# Patient Record
Sex: Male | Born: 1935 | Race: White | Hispanic: No | Marital: Married
Health system: Southern US, Community
[De-identification: ages and names within clinical notes are randomized; demographics above are authoritative.]

---

## 2003-08-11 ENCOUNTER — Ambulatory Visit (HOSPITAL_COMMUNITY): Admission: RE | Admit: 2003-08-11 | Discharge: 2003-08-12 | Payer: Self-pay | Admitting: Cardiovascular Disease

## 2009-01-30 ENCOUNTER — Encounter (INDEPENDENT_AMBULATORY_CARE_PROVIDER_SITE_OTHER): Payer: Self-pay | Admitting: General Surgery

## 2009-01-30 ENCOUNTER — Ambulatory Visit (HOSPITAL_COMMUNITY): Admission: RE | Admit: 2009-01-30 | Discharge: 2009-01-30 | Payer: Self-pay | Admitting: General Surgery

## 2009-04-30 ENCOUNTER — Inpatient Hospital Stay (HOSPITAL_COMMUNITY): Admission: AD | Admit: 2009-04-30 | Discharge: 2009-05-04 | Payer: Self-pay | Admitting: Cardiovascular Disease

## 2010-10-10 LAB — BASIC METABOLIC PANEL
BUN: 9 mg/dL (ref 6–23)
Calcium: 8.6 mg/dL (ref 8.4–10.5)
Calcium: 8.7 mg/dL (ref 8.4–10.5)
GFR calc Af Amer: >60 mL/min (ref >60)
GFR calc non Af Amer: >60 mL/min (ref >60)
GFR calc non Af Amer: >60 mL/min (ref >60)
Glucose, Bld: 113 mg/dL — ABNORMAL HIGH (ref 70–99)
Glucose, Bld: 116 mg/dL — ABNORMAL HIGH (ref 70–99)
Sodium: 135 mEq/L (ref 135–145)
Sodium: 143 mEq/L (ref 135–145)

## 2010-10-10 LAB — COMPREHENSIVE METABOLIC PANEL
ALT: 22 U/L (ref 0–53)
AST: 25 U/L (ref 0–37)
Alkaline Phosphatase: 49 U/L (ref 39–117)
CO2: 30 mEq/L (ref 19–32)
Calcium: 9.4 mg/dL (ref 8.4–10.5)
Chloride: 102 mEq/L (ref 96–112)
GFR calc Af Amer: >60 mL/min (ref >60)
GFR calc non Af Amer: >60 mL/min (ref >60)
Glucose, Bld: 95 mg/dL (ref 70–99)
Potassium: 3.6 mEq/L (ref 3.5–5.1)
Sodium: 140 mEq/L (ref 135–145)

## 2010-10-10 LAB — CBC
HCT: 43.3 % (ref 39.0–52.0)
Hemoglobin: 15 g/dL (ref 13.0–17.0)
Hemoglobin: 16.5 g/dL (ref 13.0–17.0)
MCHC: 34.5 g/dL (ref 30.0–36.0)
MCV: 86.7 fL (ref 78.0–100.0)
Platelets: 203 10*3/uL (ref 150–400)
Platelets: 214 10*3/uL (ref 150–400)
RBC: 5 MIL/uL (ref 4.22–5.81)
RBC: 5.53 MIL/uL (ref 4.22–5.81)
RDW: 14.4 % (ref 11.5–15.5)
RDW: 14.5 % (ref 11.5–15.5)
WBC: 6.7 10*3/uL (ref 4.0–10.5)
WBC: 7.1 10*3/uL (ref 4.0–10.5)
WBC: 8.2 10*3/uL (ref 4.0–10.5)

## 2010-10-10 LAB — GLUCOSE, CAPILLARY
Glucose-Capillary: 103 mg/dL — ABNORMAL HIGH (ref 70–99)
Glucose-Capillary: 112 mg/dL — ABNORMAL HIGH (ref 70–99)
Glucose-Capillary: 113 mg/dL — ABNORMAL HIGH (ref 70–99)
Glucose-Capillary: 124 mg/dL — ABNORMAL HIGH (ref 70–99)
Glucose-Capillary: 125 mg/dL — ABNORMAL HIGH (ref 70–99)
Glucose-Capillary: 128 mg/dL — ABNORMAL HIGH (ref 70–99)
Glucose-Capillary: 128 mg/dL — ABNORMAL HIGH (ref 70–99)
Glucose-Capillary: 135 mg/dL — ABNORMAL HIGH (ref 70–99)
Glucose-Capillary: 163 mg/dL — ABNORMAL HIGH (ref 70–99)
Glucose-Capillary: 189 mg/dL — ABNORMAL HIGH (ref 70–99)
Glucose-Capillary: 77 mg/dL (ref 70–99)

## 2010-10-10 LAB — CARDIAC PANEL(CRET KIN+CKTOT+MB+TROPI)
CK, MB: 3.4 ng/mL (ref 0.3–4.0)
CK, MB: 5.8 ng/mL — ABNORMAL HIGH (ref 0.3–4.0)
Relative Index: 5.7 — ABNORMAL HIGH (ref 0.0–2.5)
Total CK: 101 U/L (ref 7–232)
Total CK: 90 U/L (ref 7–232)
Troponin I: 0.72 ng/mL (ref 0.00–0.06)

## 2010-10-10 LAB — HEMOGLOBIN A1C: Hgb A1c MFr Bld: 6.3 % — ABNORMAL HIGH (ref 4.6–6.1)

## 2010-10-10 LAB — PROTIME-INR: INR: 1.1 (ref 0.00–1.49)

## 2010-10-13 LAB — GLUCOSE, CAPILLARY: Glucose-Capillary: 111 mg/dL — ABNORMAL HIGH (ref 70–99)

## 2010-11-19 NOTE — H&P (Signed)
Christopher Carey, Christopher Carey                 ACCOUNT NO.:  [id number]   MEDICAL RECORD NO.:  [id number]          PATIENT TYPE:  AMB   LOCATION:  DAY                           FACILITY:  [facility]   PHYSICIAN:  Dalia Heading, M.D.  DATE OF BIRTH:  May 26, 1936   DATE OF ADMISSION:  DATE OF DISCHARGE:  LH                              HISTORY & PHYSICAL   CHIEF COMPLAINT:  Need for screening colonoscopy.   HISTORY OF PRESENT ILLNESS:  The patient is a 75 year old white male who  is referred for endoscopic evaluation.  He needs colonoscopy for  screening purposes.  No abdominal pain, weight loss, nausea, vomiting,  diarrhea, constipation, melena, hematochezia had been noted.  He has  never had a colonoscopy.  There is no family history of colon carcinoma.   PAST MEDICAL HISTORY:  Non-insulin dependent diabetes mellitus,  hypertension.   PAST SURGICAL HISTORY:  Coronary stent placement in the remote past.   CURRENT MEDICATIONS:  Avalide 30/25 mg p.o. daily, metformin 500 mg p.o.  daily, baby aspirin 81 mg p.o. daily.   ALLERGIES:  No known drug allergies.   REVIEW OF SYSTEMS:  Noncontributory.   PHYSICAL EXAMINATION:  GENERAL:  The patient is a well-developed, well-  nourished white male in no acute distress.  LUNGS:  Clear to auscultation with equal breath sounds bilaterally.  HEART:  Regular rate and rhythm without S3, S4, or murmurs.  ABDOMEN:  Soft, nontender, nondistended.  No hepatosplenomegaly or  masses noted.  RECTAL:  Deferred to the procedure.   IMPRESSION:  Need for screening colonoscopy.   PLAN:  The patient is scheduled for colonoscopy on January 30, 2009.  The  risks and benefits of the procedure including bleeding and perforation  were fully explained to the patient, gave informed consent.      Dalia Heading, M.D.  Electronically Signed     MAJ/MEDQ  D:  01/23/2009  T:  01/24/2009  Job:  [id number]   cc:   Corrie Mckusick, M.D.  Fax: [phone]

## 2010-11-22 NOTE — Cardiovascular Report (Signed)
Christopher Carey, WATCHMAN NO.:  [id number]   MEDICAL RECORD NO.:  [id number]                   PATIENT TYPE:  OIB   LOCATION:  6525                                 FACILITY:  [facility]   PHYSICIAN:  Nanetta Batty, M.D.                DATE OF BIRTH:  Mar 01, 1936   DATE OF PROCEDURE:  06/09/2004  DATE OF DISCHARGE:  08/12/2003                              CARDIAC CATHETERIZATION   INDICATIONS:  Mr. Symanski is a 75 year old gentleman patient of Dr. Beatrice Lecher  with history of hypertension and family history of coronary artery disease  who is complaining of chest pain and shortness of breath.  Cardiolite stress  test performed August 07, 2003 showed inferolateral ischemia.  He presents  now for diagnostic coronary arteriography.   PROCEDURE DESCRIPTION:  The patient was brought to the second floor [facility] in the postabsorptive state.  He was  premedicated with p.o. Valium and IV Nubain.  His right groin was prepped  and shaved in the usual sterile fashion.  1% Xylocaine was used for local  anesthesia.  A 6 French sheath was inserted into the right femoral artery  using standard Seldinger technique. A 6 French right and left Judkins  diagnostic catheter as well as 6 French pigtail catheter were used for  selective coronary angiography, left ventriculography, subselective left  internal mammary artery angiography and distal abdominal aortography.  Omnipaque dye was used for the entirety of the case. Retrograde aortic,  ventricular and pullback pressures were recorded.   HEMODYNAMICS:  1. Aortic systolic pressure 150, diastolic pressure 83.  2. Left ventricular systolic pressure 157, end-diastolic pressure 28.   SELECTIVE CORONARY ANGIOGRAPHY:  1. Left main normal.  2. LAD:  The LAD had an 80% fairly concentric focal lesion in its proximal     portion just at the first septal perforator and after the first diagonal     branch.  3.  Left circumflex:  This is a left dominant system.  The first marginal     branch that was approximately 2.0 to 2.3 vessel that was approximately     bifurcating.  There is 80% proximal stenosis.  The inferior branch of the     bifurcation was totally occluded and filled by left-to-left collaterals.  4. Right coronary artery:  Nondominant, small and insignificant disease.   LEFT VENTRICULOGRAPHY:  RAO left ventriculogram was performed using 25 mL of  Omnipaque dye at 12 mL per second.  The overall LVEF is estimated at  approximately 50% without focal wall motion abnormalities.   DISTAL ABDOMINAL AORTOGRAPHY:  Distal abdominal aortogram was performed  using 20 mL of Omnipaque dye, 20 mL per second. The renal arteries were  widely patent.  The infrarenal abdominal aorta and iliac bifurcation  appeared free of significant atherosclerotic changes.   IMPRESSION:  Mr. Allcock has high grade proximal left anterior descending  disease and circumflex marginal disease in a left dominant system.  Plans  will be for PCI and stenting of the left anterior descending using a Taxus  drug-eluting stent and Angiomax anticoagulation followed by  __________circumflex obtuse marginal branch PCI.   PROCEDURE DESCRIPTION:  The existing 6 French sheath in the right femoral  artery was exchanged over wire for a 7 [place] sheath.  A 6 French sheath was  then inserted into the right femoral vein.  A 7 [place] JL-4 guide catheter  along with a 0.014, 190 support guide wire and a 2.5/12 Quantum Maverick  were used for PCI.  The patient received 325 mg of enteric coated aspirin  this morning, 600 mg of p.o. Plavix and Angiomax bolus with an ACT of  approximately 300.  Omnipaque dye was used for the entirety of the case.  Retrograde aortic pressure was monitored during the case.   The 0.014 support wire was placed across the LAD stenosis and predilatation  was performed with 2.5/12 Quantum at 10 atmospheres.  Following  this, a  3.5/16 Taxus drug-eluting stent was then deployed at 13 atmospheres (3.75  mm) and 200 mcg of intracoronary nitroglycerin was then administered.  Final  angiographic result was reduction of 80% concentric proximal focal LAD  stenosis to 0% residual.  There was some encroachment of the first septal  perforator.   The wire was then withdrawn and advanced down the circumflex into the  marginal branch.  PCI was performed with a 2.0/12 Quantum at nominal  pressures.  Attempts were then made to wire the inferior branch, the  bifurcating marginal unsuccessfully.  I was then unable to rewire the  superior branch.  There was TIMI-3 flow at the end of the case with  approximately 40-50% residual stenosis in the proximal marginal.  The  patient did complain of some chest pain at the end of the case which  resolved with nitroglycerin and Nubain.  There were no electrocardiographic  or hemodynamic sequela.   OVERALL IMPRESSION:  Successful proximal left anterior descending  percutaneous coronary intervention and stenting using Angiomax, aspirin,  Plavix and a drug-eluting stent with PCI of the circumflex marginal branch  and suboptimal result.  However, the marginal branch was a small vessel.  The wire and guide catheter were removed.  The sheaths were sewn securely in  place.  The patient left the lab in stable condition.  Sheaths will be  removed in approximately 2 hours.  His enzymes will be cycled.  He will be  discharged home in the morning on medical therapy, aspirin and Plavix.                                               Nanetta Batty, M.D.    Cordelia Pen  D:  08/11/2003  T:  08/12/2003  Job:  [id number]   cc:   [facility]   Corrie Mckusick, M.D.  [address] [place]  [place]   [address]  Fax: [phone]

## 2010-11-22 NOTE — Discharge Summary (Signed)
NAMEVENUS, Christopher Carey                             ACCOUNT NO.:  [id number]   MEDICAL RECORD NO.:  [id number]                   PATIENT TYPE:  OIB   LOCATION:  6525                                 FACILITY:  [facility]   PHYSICIAN:  Nanetta Batty, M.D.                DATE OF BIRTH:  Nov 15, 1935   DATE OF ADMISSION:  DATE OF DISCHARGE:  08/12/2003                                 DISCHARGE SUMMARY   DISCHARGE DIAGNOSES:  1. Coronary artery disease.  2. Hypertension.  3. Glaucoma.  4. Chronic skin rash.  5. Significant premature family history of heart disease.   HISTORY OF PRESENT ILLNESS/HOSPITAL COURSE:  This is a 75 year old  gentleman, patient of Dr. Allyson Sabal who underwent outpatient Cardiolite which  showed a normal result and was scheduled for elective cardiac  catheterization.   He presented to [facility] on August 11, 2003, and was admitted  through [facility], and underwent catheterization on August 11, 2003.  His catheterization showed high-grade lesion of the left anterior descending  and obtuse marginal branch of circumflex stenosis.   Dr. Allyson Sabal performed angioplasty and stenting of the LAD with reduction of  80% lesion in the  proximal left anterior descending to 0%, and angioplasty  was performed on the obtuse marginal branch of the circumflex with a  reduction of the lesion from 80 to 40 to 50%.  The patient tolerated the  procedure well.   The next morning, he was assessed by Dr. Allyson Sabal.  His vital signs were  stable.  His right groin revealed a soft bruit, but the patient did not  present any complaints of groin pain or discomfort.   He was stable for discharge home on aspirin and Plavix.  He was informed  that if right groin developed some signs of oozing or swelling or any other  abnormal signs, he __________ would follow up with groin duplex ultrasound.  Otherwise, the patient is to see Dr. Allyson Sabal in two weeks in our office for  followup.   DISCHARGE  ACTIVITY:  1. No driving for three days.  2. After catheterization, no strenuous activity, no lifting greater than     five pounds.   DISCHARGE DIET:  Low fat, low cholesterol diet.   DISCHARGE MEDICATIONS:  1. Lopressor 50 mg daily.  2. Baby aspirin 81 mg daily.  3. Plavix 75 mg daily.      Raymon Mutton, P.A.                    Nanetta Batty, M.D.    MK/MEDQ  D:  08/12/2003  T:  08/13/2003  Job:  [id number]   cc:   [facility]

## 2011-07-22 ENCOUNTER — Ambulatory Visit (HOSPITAL_COMMUNITY)
Admission: RE | Admit: 2011-07-22 | Discharge: 2011-07-22 | Disposition: A | Discharge status: Home / Self Care | Payer: Medicare Other | Source: Ambulatory Visit | Attending: Internal Medicine | Admitting: Internal Medicine

## 2011-07-22 ENCOUNTER — Other Ambulatory Visit (HOSPITAL_COMMUNITY): Payer: Self-pay | Admitting: Internal Medicine

## 2011-07-22 DIAGNOSIS — J069 Acute upper respiratory infection, unspecified: Secondary | ICD-10-CM

## 2011-07-22 DIAGNOSIS — R059 Cough, unspecified: Secondary | ICD-10-CM | POA: Insufficient documentation

## 2011-07-22 DIAGNOSIS — R05 Cough: Secondary | ICD-10-CM | POA: Insufficient documentation

## 2013-02-10 IMAGING — CR DG CHEST 2V
2 series · 2 of 2 positions shown · non-contrast
Comparison: April 30, 2009

CLINICAL DATA: Cough, upper respiratory infection

CHEST - 2 VIEW

[view not recorded (1 of 2)]
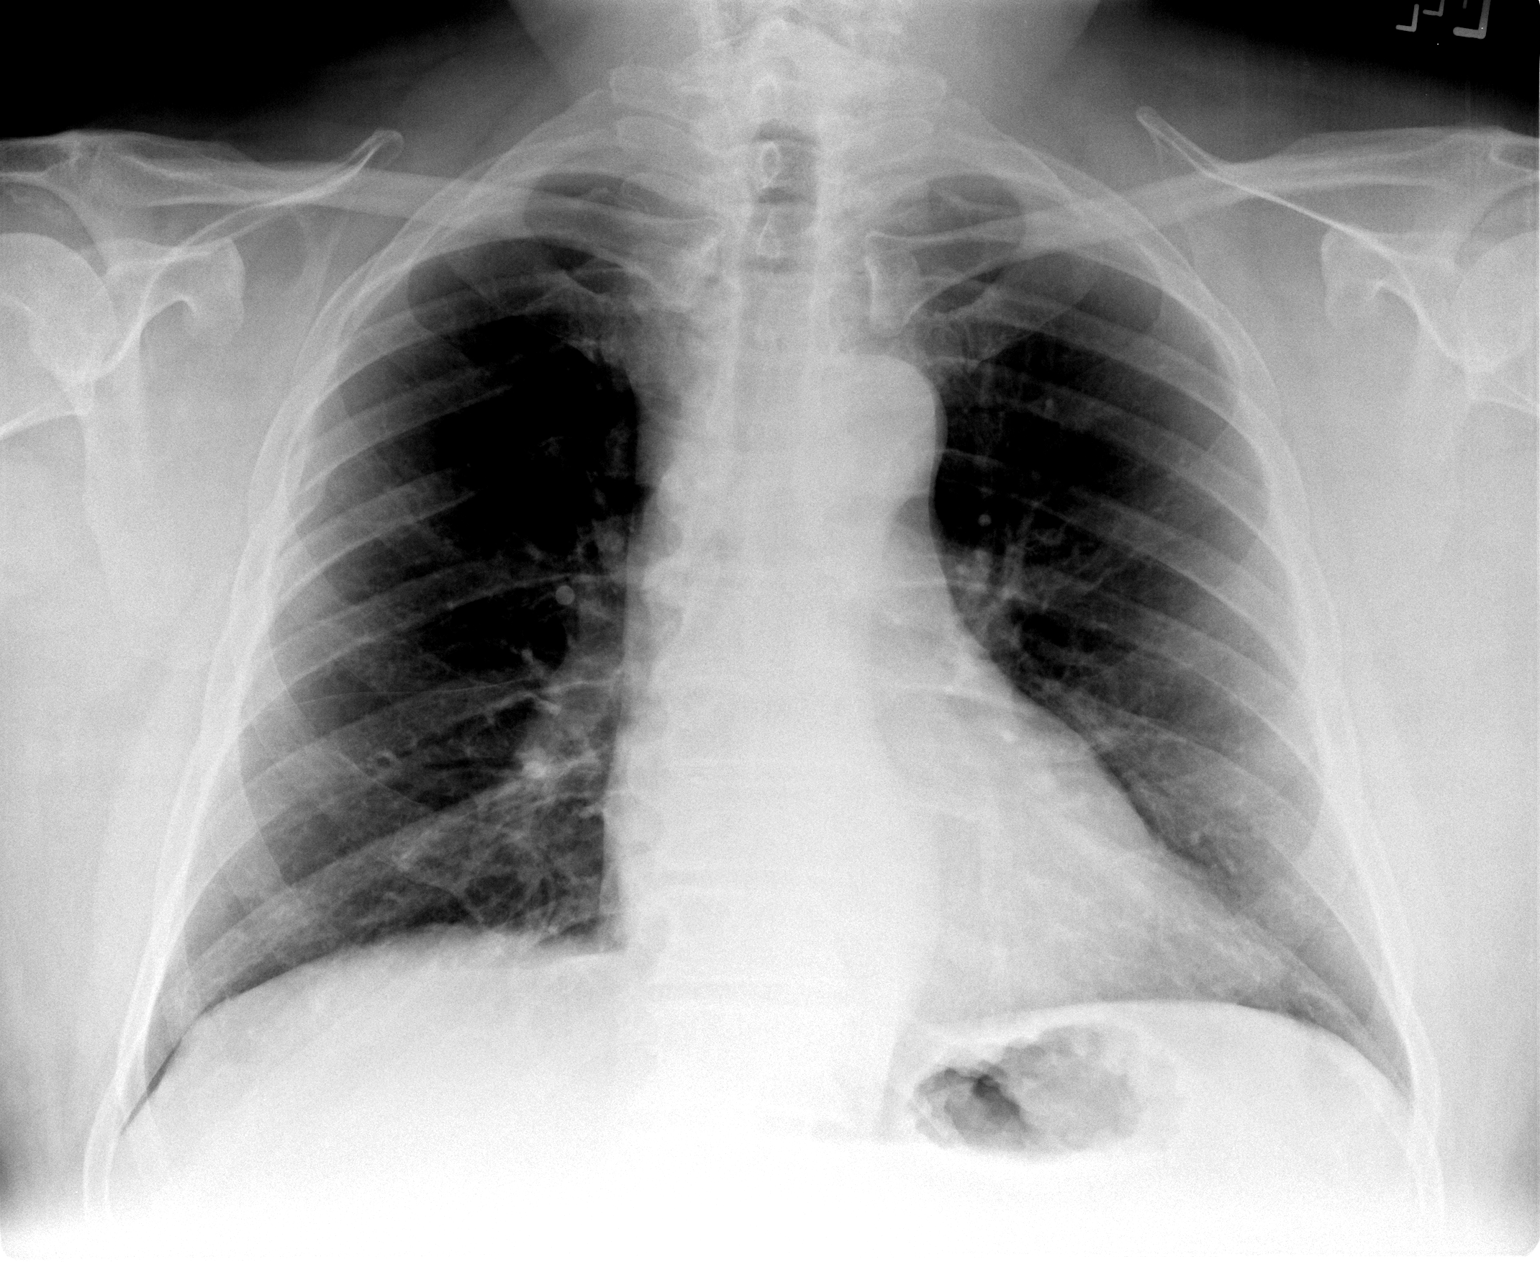

[view not recorded (2 of 2)]
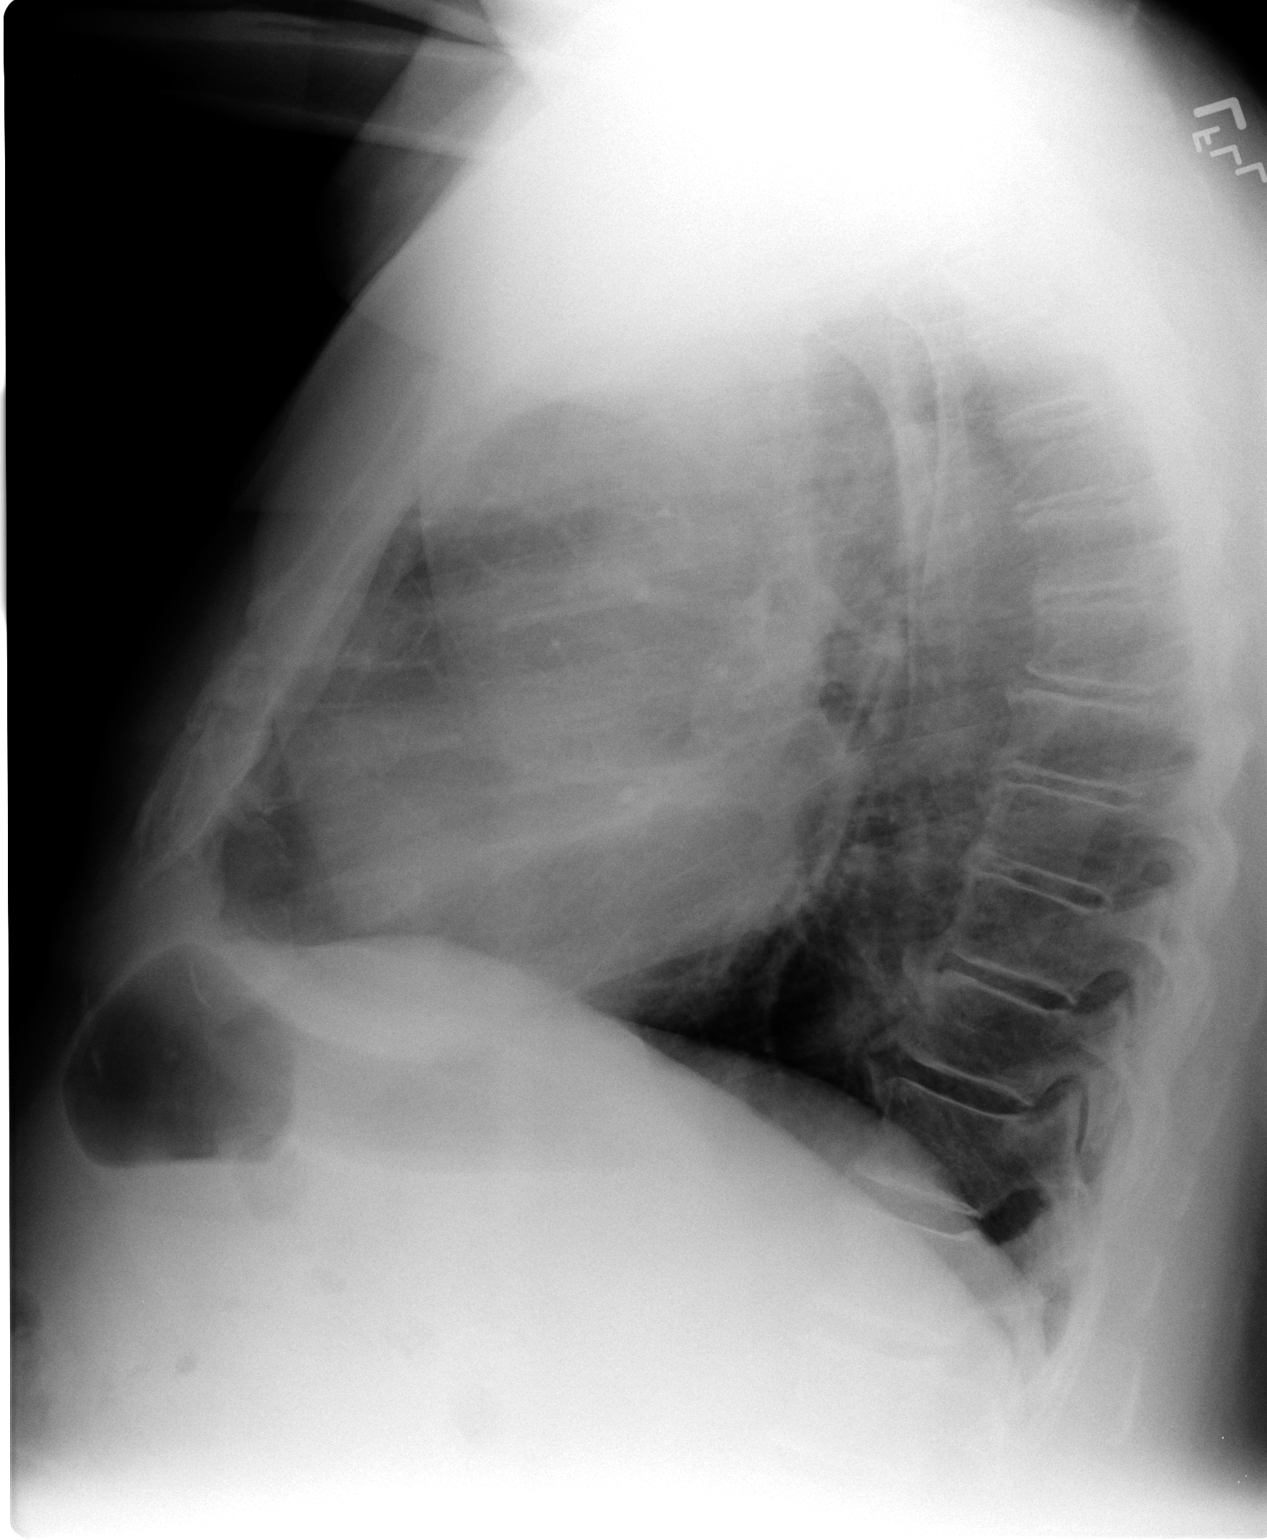

[2 of 2 positions shown; findings below may reference images not displayed]

FINDINGS: The cardiac silhouette, mediastinum, pulmonary
vasculature are within normal limits.  Both lungs are clear.
There is no acute bony abnormality.
IMPRESSION: There is no evidence of acute cardiac or pulmonary process.

## 2014-09-18 DIAGNOSIS — Z6838 Body mass index (BMI) 38.0-38.9, adult: Secondary | ICD-10-CM | POA: Diagnosis not present

## 2014-09-18 DIAGNOSIS — I10 Essential (primary) hypertension: Secondary | ICD-10-CM | POA: Diagnosis not present

## 2014-09-18 DIAGNOSIS — E782 Mixed hyperlipidemia: Secondary | ICD-10-CM | POA: Diagnosis not present

## 2014-09-18 DIAGNOSIS — R972 Elevated prostate specific antigen [PSA]: Secondary | ICD-10-CM | POA: Diagnosis not present

## 2014-09-18 DIAGNOSIS — E119 Type 2 diabetes mellitus without complications: Secondary | ICD-10-CM | POA: Diagnosis not present

## 2015-02-13 DIAGNOSIS — Z6841 Body Mass Index (BMI) 40.0 and over, adult: Secondary | ICD-10-CM | POA: Diagnosis not present

## 2015-02-13 DIAGNOSIS — I252 Old myocardial infarction: Secondary | ICD-10-CM | POA: Diagnosis not present

## 2015-02-13 DIAGNOSIS — I10 Essential (primary) hypertension: Secondary | ICD-10-CM | POA: Diagnosis not present

## 2015-02-13 DIAGNOSIS — E119 Type 2 diabetes mellitus without complications: Secondary | ICD-10-CM | POA: Diagnosis not present

## 2015-08-29 DIAGNOSIS — Z0000 Encounter for general adult medical examination without abnormal findings: Secondary | ICD-10-CM | POA: Diagnosis not present

## 2015-08-29 DIAGNOSIS — Z1389 Encounter for screening for other disorder: Secondary | ICD-10-CM | POA: Diagnosis not present

## 2015-08-29 DIAGNOSIS — Z6838 Body mass index (BMI) 38.0-38.9, adult: Secondary | ICD-10-CM | POA: Diagnosis not present

## 2015-09-12 DIAGNOSIS — I10 Essential (primary) hypertension: Secondary | ICD-10-CM | POA: Diagnosis not present

## 2015-09-12 DIAGNOSIS — E782 Mixed hyperlipidemia: Secondary | ICD-10-CM | POA: Diagnosis not present

## 2015-09-12 DIAGNOSIS — Z6837 Body mass index (BMI) 37.0-37.9, adult: Secondary | ICD-10-CM | POA: Diagnosis not present

## 2015-09-12 DIAGNOSIS — E119 Type 2 diabetes mellitus without complications: Secondary | ICD-10-CM | POA: Diagnosis not present

## 2015-09-12 DIAGNOSIS — Z1389 Encounter for screening for other disorder: Secondary | ICD-10-CM | POA: Diagnosis not present

## 2016-09-04 DIAGNOSIS — Z125 Encounter for screening for malignant neoplasm of prostate: Secondary | ICD-10-CM | POA: Diagnosis not present

## 2016-09-04 DIAGNOSIS — I2510 Atherosclerotic heart disease of native coronary artery without angina pectoris: Secondary | ICD-10-CM | POA: Diagnosis not present

## 2016-09-04 DIAGNOSIS — Z0000 Encounter for general adult medical examination without abnormal findings: Secondary | ICD-10-CM | POA: Diagnosis not present

## 2016-09-04 DIAGNOSIS — Z1389 Encounter for screening for other disorder: Secondary | ICD-10-CM | POA: Diagnosis not present

## 2016-09-04 DIAGNOSIS — I10 Essential (primary) hypertension: Secondary | ICD-10-CM | POA: Diagnosis not present

## 2016-09-04 DIAGNOSIS — E1165 Type 2 diabetes mellitus with hyperglycemia: Secondary | ICD-10-CM | POA: Diagnosis not present

## 2016-09-04 DIAGNOSIS — Z6836 Body mass index (BMI) 36.0-36.9, adult: Secondary | ICD-10-CM | POA: Diagnosis not present

## 2016-09-04 DIAGNOSIS — E782 Mixed hyperlipidemia: Secondary | ICD-10-CM | POA: Diagnosis not present

## 2016-10-22 DIAGNOSIS — H40013 Open angle with borderline findings, low risk, bilateral: Secondary | ICD-10-CM | POA: Diagnosis not present

## 2016-10-22 DIAGNOSIS — H40053 Ocular hypertension, bilateral: Secondary | ICD-10-CM | POA: Diagnosis not present

## 2016-10-22 DIAGNOSIS — Z961 Presence of intraocular lens: Secondary | ICD-10-CM | POA: Diagnosis not present

## 2017-01-29 DIAGNOSIS — E0590 Thyrotoxicosis, unspecified without thyrotoxic crisis or storm: Secondary | ICD-10-CM | POA: Diagnosis not present

## 2017-05-11 DIAGNOSIS — H40053 Ocular hypertension, bilateral: Secondary | ICD-10-CM | POA: Diagnosis not present

## 2017-05-11 DIAGNOSIS — H40013 Open angle with borderline findings, low risk, bilateral: Secondary | ICD-10-CM | POA: Diagnosis not present

## 2017-06-25 DIAGNOSIS — I10 Essential (primary) hypertension: Secondary | ICD-10-CM | POA: Diagnosis not present

## 2017-06-25 DIAGNOSIS — R29714 NIHSS score 14: Secondary | ICD-10-CM | POA: Diagnosis not present

## 2017-06-25 DIAGNOSIS — G8191 Hemiplegia, unspecified affecting right dominant side: Secondary | ICD-10-CM | POA: Diagnosis not present

## 2017-06-25 DIAGNOSIS — Z881 Allergy status to other antibiotic agents status: Secondary | ICD-10-CM | POA: Diagnosis not present

## 2017-06-25 DIAGNOSIS — I2510 Atherosclerotic heart disease of native coronary artery without angina pectoris: Secondary | ICD-10-CM | POA: Diagnosis not present

## 2017-06-25 DIAGNOSIS — Z955 Presence of coronary angioplasty implant and graft: Secondary | ICD-10-CM | POA: Diagnosis not present

## 2017-06-25 DIAGNOSIS — I444 Left anterior fascicular block: Secondary | ICD-10-CM | POA: Diagnosis not present

## 2017-06-25 DIAGNOSIS — I69898 Other sequelae of other cerebrovascular disease: Secondary | ICD-10-CM | POA: Diagnosis not present

## 2017-06-25 DIAGNOSIS — K5900 Constipation, unspecified: Secondary | ICD-10-CM | POA: Diagnosis not present

## 2017-06-25 DIAGNOSIS — R2689 Other abnormalities of gait and mobility: Secondary | ICD-10-CM | POA: Diagnosis not present

## 2017-06-25 DIAGNOSIS — R200 Anesthesia of skin: Secondary | ICD-10-CM | POA: Diagnosis not present

## 2017-06-25 DIAGNOSIS — M6281 Muscle weakness (generalized): Secondary | ICD-10-CM | POA: Diagnosis not present

## 2017-06-25 DIAGNOSIS — E669 Obesity, unspecified: Secondary | ICD-10-CM | POA: Diagnosis not present

## 2017-06-25 DIAGNOSIS — I69922 Dysarthria following unspecified cerebrovascular disease: Secondary | ICD-10-CM | POA: Diagnosis not present

## 2017-06-25 DIAGNOSIS — E785 Hyperlipidemia, unspecified: Secondary | ICD-10-CM | POA: Diagnosis not present

## 2017-06-25 DIAGNOSIS — R1312 Dysphagia, oropharyngeal phase: Secondary | ICD-10-CM | POA: Diagnosis not present

## 2017-06-25 DIAGNOSIS — I6782 Cerebral ischemia: Secondary | ICD-10-CM | POA: Diagnosis not present

## 2017-06-25 DIAGNOSIS — T466X5A Adverse effect of antihyperlipidemic and antiarteriosclerotic drugs, initial encounter: Secondary | ICD-10-CM | POA: Diagnosis not present

## 2017-06-25 DIAGNOSIS — R29810 Facial weakness: Secondary | ICD-10-CM | POA: Diagnosis not present

## 2017-06-25 DIAGNOSIS — Z7982 Long term (current) use of aspirin: Secondary | ICD-10-CM | POA: Diagnosis not present

## 2017-06-25 DIAGNOSIS — G459 Transient cerebral ischemic attack, unspecified: Secondary | ICD-10-CM | POA: Diagnosis not present

## 2017-06-25 DIAGNOSIS — I452 Bifascicular block: Secondary | ICD-10-CM | POA: Diagnosis not present

## 2017-06-25 DIAGNOSIS — R4701 Aphasia: Secondary | ICD-10-CM | POA: Diagnosis not present

## 2017-06-25 DIAGNOSIS — E119 Type 2 diabetes mellitus without complications: Secondary | ICD-10-CM | POA: Diagnosis not present

## 2017-06-25 DIAGNOSIS — I6389 Other cerebral infarction: Secondary | ICD-10-CM | POA: Diagnosis not present

## 2017-06-25 DIAGNOSIS — Z794 Long term (current) use of insulin: Secondary | ICD-10-CM | POA: Diagnosis not present

## 2017-06-25 DIAGNOSIS — I639 Cerebral infarction, unspecified: Secondary | ICD-10-CM | POA: Diagnosis not present

## 2017-06-25 DIAGNOSIS — I228 Subsequent ST elevation (STEMI) myocardial infarction of other sites: Secondary | ICD-10-CM | POA: Diagnosis not present

## 2017-06-25 DIAGNOSIS — I4510 Unspecified right bundle-branch block: Secondary | ICD-10-CM | POA: Diagnosis not present

## 2017-06-25 DIAGNOSIS — G8190 Hemiplegia, unspecified affecting unspecified side: Secondary | ICD-10-CM | POA: Diagnosis not present

## 2017-06-25 DIAGNOSIS — I2129 ST elevation (STEMI) myocardial infarction involving other sites: Secondary | ICD-10-CM | POA: Diagnosis not present

## 2017-06-25 DIAGNOSIS — E1159 Type 2 diabetes mellitus with other circulatory complications: Secondary | ICD-10-CM | POA: Diagnosis not present

## 2017-06-25 DIAGNOSIS — B372 Candidiasis of skin and nail: Secondary | ICD-10-CM | POA: Diagnosis not present

## 2017-06-25 DIAGNOSIS — R531 Weakness: Secondary | ICD-10-CM | POA: Diagnosis not present

## 2017-06-25 DIAGNOSIS — I6381 Other cerebral infarction due to occlusion or stenosis of small artery: Secondary | ICD-10-CM | POA: Diagnosis not present

## 2017-06-25 DIAGNOSIS — R633 Feeding difficulties: Secondary | ICD-10-CM | POA: Diagnosis not present

## 2017-06-25 DIAGNOSIS — I69920 Aphasia following unspecified cerebrovascular disease: Secondary | ICD-10-CM | POA: Diagnosis not present

## 2017-06-25 DIAGNOSIS — I69991 Dysphagia following unspecified cerebrovascular disease: Secondary | ICD-10-CM | POA: Diagnosis not present

## 2017-06-25 DIAGNOSIS — Z6837 Body mass index (BMI) 37.0-37.9, adult: Secondary | ICD-10-CM | POA: Diagnosis not present

## 2017-06-25 DIAGNOSIS — I252 Old myocardial infarction: Secondary | ICD-10-CM | POA: Diagnosis not present

## 2017-06-25 DIAGNOSIS — G464 Cerebellar stroke syndrome: Secondary | ICD-10-CM | POA: Diagnosis not present

## 2017-06-25 DIAGNOSIS — L308 Other specified dermatitis: Secondary | ICD-10-CM | POA: Diagnosis not present

## 2017-06-25 DIAGNOSIS — R279 Unspecified lack of coordination: Secondary | ICD-10-CM | POA: Diagnosis not present

## 2017-07-03 ENCOUNTER — Other Ambulatory Visit: Payer: Self-pay | Admitting: *Deleted

## 2017-07-03 DIAGNOSIS — I69991 Dysphagia following unspecified cerebrovascular disease: Secondary | ICD-10-CM | POA: Diagnosis not present

## 2017-07-03 DIAGNOSIS — E785 Hyperlipidemia, unspecified: Secondary | ICD-10-CM | POA: Diagnosis not present

## 2017-07-03 DIAGNOSIS — G8190 Hemiplegia, unspecified affecting unspecified side: Secondary | ICD-10-CM | POA: Diagnosis not present

## 2017-07-03 DIAGNOSIS — I69898 Other sequelae of other cerebrovascular disease: Secondary | ICD-10-CM | POA: Diagnosis not present

## 2017-07-03 DIAGNOSIS — K5900 Constipation, unspecified: Secondary | ICD-10-CM | POA: Diagnosis not present

## 2017-07-03 DIAGNOSIS — I639 Cerebral infarction, unspecified: Secondary | ICD-10-CM | POA: Diagnosis not present

## 2017-07-03 DIAGNOSIS — R1312 Dysphagia, oropharyngeal phase: Secondary | ICD-10-CM | POA: Diagnosis not present

## 2017-07-03 DIAGNOSIS — I69922 Dysarthria following unspecified cerebrovascular disease: Secondary | ICD-10-CM | POA: Diagnosis not present

## 2017-07-03 DIAGNOSIS — I10 Essential (primary) hypertension: Secondary | ICD-10-CM | POA: Diagnosis not present

## 2017-07-03 DIAGNOSIS — R2689 Other abnormalities of gait and mobility: Secondary | ICD-10-CM | POA: Diagnosis not present

## 2017-07-03 DIAGNOSIS — R531 Weakness: Secondary | ICD-10-CM | POA: Diagnosis not present

## 2017-07-03 DIAGNOSIS — I2510 Atherosclerotic heart disease of native coronary artery without angina pectoris: Secondary | ICD-10-CM | POA: Diagnosis not present

## 2017-07-03 DIAGNOSIS — M6281 Muscle weakness (generalized): Secondary | ICD-10-CM | POA: Diagnosis not present

## 2017-07-03 DIAGNOSIS — E669 Obesity, unspecified: Secondary | ICD-10-CM | POA: Diagnosis not present

## 2017-07-03 DIAGNOSIS — G464 Cerebellar stroke syndrome: Secondary | ICD-10-CM | POA: Diagnosis not present

## 2017-07-03 DIAGNOSIS — B372 Candidiasis of skin and nail: Secondary | ICD-10-CM | POA: Diagnosis not present

## 2017-07-03 DIAGNOSIS — E1159 Type 2 diabetes mellitus with other circulatory complications: Secondary | ICD-10-CM | POA: Diagnosis not present

## 2017-07-03 DIAGNOSIS — I69920 Aphasia following unspecified cerebrovascular disease: Secondary | ICD-10-CM | POA: Diagnosis not present

## 2017-07-03 DIAGNOSIS — R279 Unspecified lack of coordination: Secondary | ICD-10-CM | POA: Diagnosis not present

## 2017-07-03 MED ORDER — AMLODIPINE BESYLATE 5 MG PO TABS
10.00 | ORAL_TABLET | ORAL | Status: DC
Start: 2017-07-04 — End: 2017-07-03

## 2017-07-03 MED ORDER — FLUTICASONE PROPIONATE 50 MCG/ACT NA SUSP
1.00 | NASAL | Status: DC
Start: 2017-07-04 — End: 2017-07-03

## 2017-07-03 MED ORDER — CHLORTHALIDONE 25 MG PO TABS
50.00 | ORAL_TABLET | ORAL | Status: DC
Start: 2017-07-04 — End: 2017-07-03

## 2017-07-03 MED ORDER — ENOXAPARIN SODIUM 40 MG/0.4ML ~~LOC~~ SOLN
40.00 | SUBCUTANEOUS | Status: DC
Start: 2017-07-04 — End: 2017-07-03

## 2017-07-03 MED ORDER — GUAIFENESIN ER 600 MG PO TB12
600.00 | ORAL_TABLET | ORAL | Status: DC
Start: ? — End: 2017-07-03

## 2017-07-03 MED ORDER — BISACODYL 10 MG RE SUPP
10.00 | RECTAL | Status: DC
Start: ? — End: 2017-07-03

## 2017-07-03 MED ORDER — QUETIAPINE FUMARATE 25 MG PO TABS
25.00 | ORAL_TABLET | ORAL | Status: DC
Start: ? — End: 2017-07-03

## 2017-07-03 MED ORDER — INSULIN LISPRO 100 UNIT/ML ~~LOC~~ SOLN
2.00 | SUBCUTANEOUS | Status: DC
Start: 2017-07-03 — End: 2017-07-03

## 2017-07-03 MED ORDER — SILVER SULFADIAZINE 1 % EX CREA
TOPICAL_CREAM | CUTANEOUS | Status: DC
Start: 2017-07-03 — End: 2017-07-03

## 2017-07-03 MED ORDER — TRIAMCINOLONE ACETONIDE 0.1 % EX CREA
TOPICAL_CREAM | CUTANEOUS | Status: DC
Start: 2017-07-03 — End: 2017-07-03

## 2017-07-03 MED ORDER — MELATONIN 3 MG PO TABS
6.00 | ORAL_TABLET | ORAL | Status: DC
Start: 2017-07-03 — End: 2017-07-03

## 2017-07-03 MED ORDER — GENERIC EXTERNAL MEDICATION
1.00 | Status: DC
Start: 2017-07-03 — End: 2017-07-03

## 2017-07-03 MED ORDER — CLOPIDOGREL BISULFATE 75 MG PO TABS
75.00 | ORAL_TABLET | ORAL | Status: DC
Start: 2017-07-04 — End: 2017-07-03

## 2017-07-03 MED ORDER — ASPIRIN 325 MG PO TABS
325.00 | ORAL_TABLET | ORAL | Status: DC
Start: 2017-07-04 — End: 2017-07-03

## 2017-07-03 MED ORDER — LISINOPRIL 20 MG PO TABS
20.00 | ORAL_TABLET | ORAL | Status: DC
Start: 2017-07-04 — End: 2017-07-03

## 2017-07-03 MED ORDER — SENNOSIDES-DOCUSATE SODIUM 8.6-50 MG PO TABS
1.00 | ORAL_TABLET | ORAL | Status: DC
Start: 2017-07-04 — End: 2017-07-03

## 2017-07-03 MED ORDER — DEXTROSE 10 % IV SOLN
125.00 | INTRAVENOUS | Status: DC
Start: ? — End: 2017-07-03

## 2017-07-03 NOTE — Patient Outreach (Signed)
[facility]) Care Management  07/03/2017  Christopher Carey 02/08/1936 [id number]  [facility] 07/02/2017;  Telephone call to patient; phone rang with no answer; unable to leave message(recording requesting access code).  No alternate number noted.   Plan: Will follow up.  Colleen CanLinda Nickalous Stingley, RN BSN CCM Care Management Coordinator [facility] Care [facility]  [phone]

## 2017-07-06 ENCOUNTER — Other Ambulatory Visit: Payer: Self-pay | Admitting: *Deleted

## 2017-07-06 ENCOUNTER — Encounter: Payer: Self-pay | Admitting: *Deleted

## 2017-07-06 NOTE — Patient Outreach (Signed)
[facility]) Care Management  07/06/2017  Christopher Carey 08/17/1935 [id number]   [facility] 07/02/[facility];  Telephone call attempt x 2; message states number has been disconnected.   Plan: Will send outreach letter. Will follow up.  Colleen CanLinda Manasi Dishon, RN BSN CCM Care Management Coordinator [facility]  [phone]

## 2017-07-08 ENCOUNTER — Other Ambulatory Visit: Payer: Self-pay | Admitting: *Deleted

## 2017-07-08 NOTE — Patient Outreach (Signed)
[facility] Care Management  07/08/2017  Rosezella RumpfWayne A Alcala 03/22/1936 [id number]  Ref[facility] from [facility] 07/02/2017;  Telephone call attempt x 3; no answer unable to leave message.  Plan: Outreach letter has been sent. Will follow up.  Colleen CanLinda Anaisa Radi, RN BSN CCM Care Management Coordinator [facility]  [phone]

## 2017-07-09 DIAGNOSIS — I69922 Dysarthria following unspecified cerebrovascular disease: Secondary | ICD-10-CM | POA: Diagnosis not present

## 2017-07-09 DIAGNOSIS — I10 Essential (primary) hypertension: Secondary | ICD-10-CM | POA: Diagnosis not present

## 2017-07-09 DIAGNOSIS — R1312 Dysphagia, oropharyngeal phase: Secondary | ICD-10-CM | POA: Diagnosis not present

## 2017-07-09 DIAGNOSIS — I69991 Dysphagia following unspecified cerebrovascular disease: Secondary | ICD-10-CM | POA: Diagnosis not present

## 2017-07-23 DIAGNOSIS — K5900 Constipation, unspecified: Secondary | ICD-10-CM | POA: Diagnosis not present

## 2017-07-23 DIAGNOSIS — G479 Sleep disorder, unspecified: Secondary | ICD-10-CM | POA: Diagnosis not present

## 2017-07-23 DIAGNOSIS — R42 Dizziness and giddiness: Secondary | ICD-10-CM | POA: Diagnosis not present

## 2017-07-23 DIAGNOSIS — R1084 Generalized abdominal pain: Secondary | ICD-10-CM | POA: Diagnosis not present

## 2017-07-23 DIAGNOSIS — G4700 Insomnia, unspecified: Secondary | ICD-10-CM | POA: Diagnosis not present

## 2017-07-23 DIAGNOSIS — R109 Unspecified abdominal pain: Secondary | ICD-10-CM | POA: Diagnosis not present

## 2017-07-24 DIAGNOSIS — I69392 Facial weakness following cerebral infarction: Secondary | ICD-10-CM | POA: Diagnosis not present

## 2017-07-24 DIAGNOSIS — I2510 Atherosclerotic heart disease of native coronary artery without angina pectoris: Secondary | ICD-10-CM | POA: Diagnosis not present

## 2017-07-24 DIAGNOSIS — E119 Type 2 diabetes mellitus without complications: Secondary | ICD-10-CM | POA: Diagnosis not present

## 2017-07-24 DIAGNOSIS — I69322 Dysarthria following cerebral infarction: Secondary | ICD-10-CM | POA: Diagnosis not present

## 2017-07-24 DIAGNOSIS — I69320 Aphasia following cerebral infarction: Secondary | ICD-10-CM | POA: Diagnosis not present

## 2017-07-24 DIAGNOSIS — I69391 Dysphagia following cerebral infarction: Secondary | ICD-10-CM | POA: Diagnosis not present

## 2017-07-24 DIAGNOSIS — I69351 Hemiplegia and hemiparesis following cerebral infarction affecting right dominant side: Secondary | ICD-10-CM | POA: Diagnosis not present

## 2017-07-24 DIAGNOSIS — I10 Essential (primary) hypertension: Secondary | ICD-10-CM | POA: Diagnosis not present

## 2017-07-29 DIAGNOSIS — I69391 Dysphagia following cerebral infarction: Secondary | ICD-10-CM | POA: Diagnosis not present

## 2017-07-29 DIAGNOSIS — I69351 Hemiplegia and hemiparesis following cerebral infarction affecting right dominant side: Secondary | ICD-10-CM | POA: Diagnosis not present

## 2017-07-29 DIAGNOSIS — I69392 Facial weakness following cerebral infarction: Secondary | ICD-10-CM | POA: Diagnosis not present

## 2017-07-29 DIAGNOSIS — I69322 Dysarthria following cerebral infarction: Secondary | ICD-10-CM | POA: Diagnosis not present

## 2017-07-29 DIAGNOSIS — I2510 Atherosclerotic heart disease of native coronary artery without angina pectoris: Secondary | ICD-10-CM | POA: Diagnosis not present

## 2017-07-29 DIAGNOSIS — E119 Type 2 diabetes mellitus without complications: Secondary | ICD-10-CM | POA: Diagnosis not present

## 2017-07-29 DIAGNOSIS — I69320 Aphasia following cerebral infarction: Secondary | ICD-10-CM | POA: Diagnosis not present

## 2017-07-29 DIAGNOSIS — I10 Essential (primary) hypertension: Secondary | ICD-10-CM | POA: Diagnosis not present

## 2017-07-30 ENCOUNTER — Other Ambulatory Visit: Payer: Self-pay | Admitting: *Deleted

## 2017-07-30 DIAGNOSIS — I10 Essential (primary) hypertension: Secondary | ICD-10-CM | POA: Diagnosis not present

## 2017-07-30 DIAGNOSIS — E119 Type 2 diabetes mellitus without complications: Secondary | ICD-10-CM | POA: Diagnosis not present

## 2017-07-30 DIAGNOSIS — I69320 Aphasia following cerebral infarction: Secondary | ICD-10-CM | POA: Diagnosis not present

## 2017-07-30 DIAGNOSIS — I69351 Hemiplegia and hemiparesis following cerebral infarction affecting right dominant side: Secondary | ICD-10-CM | POA: Diagnosis not present

## 2017-07-30 DIAGNOSIS — I2510 Atherosclerotic heart disease of native coronary artery without angina pectoris: Secondary | ICD-10-CM | POA: Diagnosis not present

## 2017-07-30 DIAGNOSIS — I69391 Dysphagia following cerebral infarction: Secondary | ICD-10-CM | POA: Diagnosis not present

## 2017-07-30 DIAGNOSIS — I69392 Facial weakness following cerebral infarction: Secondary | ICD-10-CM | POA: Diagnosis not present

## 2017-07-30 DIAGNOSIS — I69322 Dysarthria following cerebral infarction: Secondary | ICD-10-CM | POA: Diagnosis not present

## 2017-07-30 NOTE — Patient Outreach (Signed)
[facility]) Care Management  07/30/2017  Christopher Carey 08/27/1935 [id number]  Referral via Health [employer] ; member [facility] 07/22/2017  Telephone call to patient; phones rings with no answer; unable to leave message.  Plan: Will follow up.  Colleen CanLinda Moody Robben, RN BSN CCM Care Management Coordinator [facility] Care [facility]  [phone]

## 2017-07-31 ENCOUNTER — Other Ambulatory Visit: Payer: Self-pay | Admitting: *Deleted

## 2017-07-31 DIAGNOSIS — E119 Type 2 diabetes mellitus without complications: Secondary | ICD-10-CM | POA: Diagnosis not present

## 2017-07-31 DIAGNOSIS — I69898 Other sequelae of other cerebrovascular disease: Secondary | ICD-10-CM | POA: Diagnosis not present

## 2017-07-31 DIAGNOSIS — I69320 Aphasia following cerebral infarction: Secondary | ICD-10-CM | POA: Diagnosis not present

## 2017-07-31 DIAGNOSIS — I69322 Dysarthria following cerebral infarction: Secondary | ICD-10-CM | POA: Diagnosis not present

## 2017-07-31 DIAGNOSIS — I10 Essential (primary) hypertension: Secondary | ICD-10-CM | POA: Diagnosis not present

## 2017-07-31 DIAGNOSIS — I69392 Facial weakness following cerebral infarction: Secondary | ICD-10-CM | POA: Diagnosis not present

## 2017-07-31 DIAGNOSIS — I69351 Hemiplegia and hemiparesis following cerebral infarction affecting right dominant side: Secondary | ICD-10-CM | POA: Diagnosis not present

## 2017-07-31 DIAGNOSIS — I69391 Dysphagia following cerebral infarction: Secondary | ICD-10-CM | POA: Diagnosis not present

## 2017-07-31 DIAGNOSIS — I2510 Atherosclerotic heart disease of native coronary artery without angina pectoris: Secondary | ICD-10-CM | POA: Diagnosis not present

## 2017-07-31 NOTE — Patient Outreach (Signed)
[facility]) Care Management  07/31/2017  Christopher Carey 02/08/1936 [id number]  Referral via Health Plan_; member [facility] 07/22/2017  Telephone call x 2 to patient who answered call. Advised of reason or call . HIPPA verification received from patient.  Patient confirms that he was [facility] [place], [place].     Patient agrees to answer  "TOC" questions. Spouse & daughter also on call.  List of medications given by patient's daughter. States she lives with parents &  Assists with his care. States she is an EMT.  Daughter/caregiver states patient has stroke and was [facility] [place] and then transferred to skilled center for therapy. States he has right sided weakness and some slurred speech. States he currently uses wheelchair.  States home health services for PT/OT/ST have already started.   States follow up appointment will take place 2/7 and MD-Terry Debroah Looprnold will make home visit. States Dr. Debroah LoopArnold in [place] will be primary care provider. Patient voices he has [employer] insurance coverage.  Patient & daughter states no questions or concerns at this time.  Plan:  Case closure. [facility] not [facility].   Christopher CanLinda Ariya Bohannon, RN BSN CCM Care Management Coordinator [facility] Care Management  [phone]   .

## 2017-08-03 DIAGNOSIS — I69320 Aphasia following cerebral infarction: Secondary | ICD-10-CM | POA: Diagnosis not present

## 2017-08-03 DIAGNOSIS — I69351 Hemiplegia and hemiparesis following cerebral infarction affecting right dominant side: Secondary | ICD-10-CM | POA: Diagnosis not present

## 2017-08-03 DIAGNOSIS — I2510 Atherosclerotic heart disease of native coronary artery without angina pectoris: Secondary | ICD-10-CM | POA: Diagnosis not present

## 2017-08-03 DIAGNOSIS — E119 Type 2 diabetes mellitus without complications: Secondary | ICD-10-CM | POA: Diagnosis not present

## 2017-08-03 DIAGNOSIS — I10 Essential (primary) hypertension: Secondary | ICD-10-CM | POA: Diagnosis not present

## 2017-08-03 DIAGNOSIS — I69392 Facial weakness following cerebral infarction: Secondary | ICD-10-CM | POA: Diagnosis not present

## 2017-08-03 DIAGNOSIS — I69391 Dysphagia following cerebral infarction: Secondary | ICD-10-CM | POA: Diagnosis not present

## 2017-08-03 DIAGNOSIS — I69322 Dysarthria following cerebral infarction: Secondary | ICD-10-CM | POA: Diagnosis not present

## 2017-08-04 DIAGNOSIS — I2510 Atherosclerotic heart disease of native coronary artery without angina pectoris: Secondary | ICD-10-CM | POA: Diagnosis not present

## 2017-08-04 DIAGNOSIS — I69392 Facial weakness following cerebral infarction: Secondary | ICD-10-CM | POA: Diagnosis not present

## 2017-08-04 DIAGNOSIS — I69320 Aphasia following cerebral infarction: Secondary | ICD-10-CM | POA: Diagnosis not present

## 2017-08-04 DIAGNOSIS — I10 Essential (primary) hypertension: Secondary | ICD-10-CM | POA: Diagnosis not present

## 2017-08-04 DIAGNOSIS — I69322 Dysarthria following cerebral infarction: Secondary | ICD-10-CM | POA: Diagnosis not present

## 2017-08-04 DIAGNOSIS — I69391 Dysphagia following cerebral infarction: Secondary | ICD-10-CM | POA: Diagnosis not present

## 2017-08-04 DIAGNOSIS — I69351 Hemiplegia and hemiparesis following cerebral infarction affecting right dominant side: Secondary | ICD-10-CM | POA: Diagnosis not present

## 2017-08-04 DIAGNOSIS — E119 Type 2 diabetes mellitus without complications: Secondary | ICD-10-CM | POA: Diagnosis not present

## 2017-08-05 DIAGNOSIS — I69391 Dysphagia following cerebral infarction: Secondary | ICD-10-CM | POA: Diagnosis not present

## 2017-08-05 DIAGNOSIS — I2510 Atherosclerotic heart disease of native coronary artery without angina pectoris: Secondary | ICD-10-CM | POA: Diagnosis not present

## 2017-08-05 DIAGNOSIS — I69320 Aphasia following cerebral infarction: Secondary | ICD-10-CM | POA: Diagnosis not present

## 2017-08-05 DIAGNOSIS — I10 Essential (primary) hypertension: Secondary | ICD-10-CM | POA: Diagnosis not present

## 2017-08-05 DIAGNOSIS — I69351 Hemiplegia and hemiparesis following cerebral infarction affecting right dominant side: Secondary | ICD-10-CM | POA: Diagnosis not present

## 2017-08-05 DIAGNOSIS — E119 Type 2 diabetes mellitus without complications: Secondary | ICD-10-CM | POA: Diagnosis not present

## 2017-08-05 DIAGNOSIS — I69392 Facial weakness following cerebral infarction: Secondary | ICD-10-CM | POA: Diagnosis not present

## 2017-08-05 DIAGNOSIS — I69322 Dysarthria following cerebral infarction: Secondary | ICD-10-CM | POA: Diagnosis not present

## 2017-08-06 DIAGNOSIS — I69391 Dysphagia following cerebral infarction: Secondary | ICD-10-CM | POA: Diagnosis not present

## 2017-08-06 DIAGNOSIS — E119 Type 2 diabetes mellitus without complications: Secondary | ICD-10-CM | POA: Diagnosis not present

## 2017-08-06 DIAGNOSIS — I10 Essential (primary) hypertension: Secondary | ICD-10-CM | POA: Diagnosis not present

## 2017-08-06 DIAGNOSIS — I69322 Dysarthria following cerebral infarction: Secondary | ICD-10-CM | POA: Diagnosis not present

## 2017-08-06 DIAGNOSIS — I69320 Aphasia following cerebral infarction: Secondary | ICD-10-CM | POA: Diagnosis not present

## 2017-08-06 DIAGNOSIS — I2510 Atherosclerotic heart disease of native coronary artery without angina pectoris: Secondary | ICD-10-CM | POA: Diagnosis not present

## 2017-08-06 DIAGNOSIS — I69392 Facial weakness following cerebral infarction: Secondary | ICD-10-CM | POA: Diagnosis not present

## 2017-08-06 DIAGNOSIS — I69351 Hemiplegia and hemiparesis following cerebral infarction affecting right dominant side: Secondary | ICD-10-CM | POA: Diagnosis not present
